# Patient Record
Sex: Male | Born: 1955 | Race: White | Hispanic: No | Smoking: Never smoker
Health system: Southern US, Community
[De-identification: ages and names within clinical notes are randomized; demographics above are authoritative.]

## PROBLEM LIST (undated history)

## (undated) DIAGNOSIS — I10 Essential (primary) hypertension: Secondary | ICD-10-CM

## (undated) DIAGNOSIS — E785 Hyperlipidemia, unspecified: Secondary | ICD-10-CM

## (undated) HISTORY — PX: PELVIS CLOSED REDUCTION: SHX1023

---

## 2011-02-14 ENCOUNTER — Ambulatory Visit
Admission: RE | Admit: 2011-02-14 | Discharge: 2011-02-14 | Disposition: A | Discharge status: Home / Self Care | Payer: BC Managed Care – PPO | Source: Ambulatory Visit | Attending: Family Medicine | Admitting: Family Medicine

## 2011-02-14 ENCOUNTER — Encounter: Payer: Self-pay | Admitting: Family Medicine

## 2011-02-14 ENCOUNTER — Inpatient Hospital Stay (INDEPENDENT_AMBULATORY_CARE_PROVIDER_SITE_OTHER)
Admission: RE | Admit: 2011-02-14 | Discharge: 2011-02-14 | Disposition: A | Discharge status: Home / Self Care | Payer: BC Managed Care – PPO | Source: Ambulatory Visit | Attending: Family Medicine | Admitting: Family Medicine

## 2011-02-14 ENCOUNTER — Other Ambulatory Visit: Payer: Self-pay | Admitting: Family Medicine

## 2011-02-14 DIAGNOSIS — R1032 Left lower quadrant pain: Secondary | ICD-10-CM

## 2011-02-14 DIAGNOSIS — K5730 Diverticulosis of large intestine without perforation or abscess without bleeding: Secondary | ICD-10-CM | POA: Insufficient documentation

## 2011-02-14 DIAGNOSIS — R109 Unspecified abdominal pain: Secondary | ICD-10-CM

## 2011-02-14 DIAGNOSIS — K5732 Diverticulitis of large intestine without perforation or abscess without bleeding: Secondary | ICD-10-CM

## 2011-02-14 DIAGNOSIS — I10 Essential (primary) hypertension: Secondary | ICD-10-CM

## 2011-02-14 MED ORDER — IOHEXOL 300 MG/ML  SOLN
100.0000 mL | Freq: Once | INTRAMUSCULAR | Status: AC | PRN
  Administered 2011-02-14: 100 mL via INTRAVENOUS

## 2011-02-15 ENCOUNTER — Telehealth (INDEPENDENT_AMBULATORY_CARE_PROVIDER_SITE_OTHER): Payer: Self-pay | Admitting: *Deleted

## 2011-04-15 ENCOUNTER — Encounter: Payer: Self-pay | Admitting: Family Medicine

## 2011-04-15 ENCOUNTER — Inpatient Hospital Stay (INDEPENDENT_AMBULATORY_CARE_PROVIDER_SITE_OTHER)
Admission: RE | Admit: 2011-04-15 | Discharge: 2011-04-15 | Disposition: A | Discharge status: Home / Self Care | Payer: BC Managed Care – PPO | Source: Ambulatory Visit | Attending: Family Medicine | Admitting: Family Medicine

## 2011-04-15 DIAGNOSIS — M109 Gout, unspecified: Secondary | ICD-10-CM

## 2011-04-15 DIAGNOSIS — I10 Essential (primary) hypertension: Secondary | ICD-10-CM

## 2011-09-22 NOTE — Letter (Signed)
Summary: Out of Work  [facility]  [address]   [place], [place] [address]   Phone: [phone]  Fax: [phone]    April 15, 2011   Employee:  Thelonious Drabik    To Whom It May Concern:   For Medical reasons, please excuse the above named employee from work today and tomorrow.   If you need additional information, please feel free to contact our office.         Sincerely,    Donna Christen MD

## 2011-09-22 NOTE — Progress Notes (Signed)
Summary: RIGHT FOOT PAIN/ANKLW (rm 2)   Vital Signs:  Patient Profile:   55 Years Old Male CC:      right foot/ankle pain Height:     70 inches Weight:      214 pounds O2 Sat:      98 % O2 treatment:    Room Air Temp:     98.1 degrees F oral Pulse rate:   86 / minute Resp:     16 per minute BP sitting:   176 / 127  (left arm)  Pt. in pain?   yes    Location:   right foot/ankle    Intensity:   8    Type:       throbbing  Vitals Entered By: Lajean Saver RN (April 15, 2011 9:01 AM)                  Serial Vital Signs/Assessments:  Time      Position  BP       Pulse  Resp  Temp     By 9:03 AM             161/128                        Lajean Saver RN   Updated Prior Medication List: TOPROL XL 100 MG XR24H-TAB (METOPROLOL SUCCINATE)   Current Allergies: No known allergies History of Present Illness Chief Complaint: right foot/ankle pain History of Present Illness:  Subjective:  Patient complains of a history of occasional flareups of gout in feet.  He has had recurrent pain/swelling in right ankle for about a week, not responding to Goody's Powder.  He reports that he ran out of his BP med, but has contacted his PCP for refill.  He feels well otherwise  REVIEW OF SYSTEMS Constitutional Symptoms      Denies fever, chills, night sweats, weight loss, weight gain, and fatigue.  Eyes       Denies change in vision, eye pain, eye discharge, glasses, contact lenses, and eye surgery. Ear/Nose/Throat/Mouth       Denies hearing loss/aids, change in hearing, ear pain, ear discharge, dizziness, frequent runny nose, frequent nose bleeds, sinus problems, sore throat, hoarseness, and tooth pain or bleeding.  Respiratory       Denies dry cough, productive cough, wheezing, shortness of breath, asthma, bronchitis, and emphysema/COPD.  Cardiovascular       Denies murmurs, chest pain, and tires easily with exhertion.    Gastrointestinal       Denies stomach pain, nausea/vomiting,  diarrhea, constipation, blood in bowel movements, and indigestion. Genitourniary       Denies painful urination, kidney stones, and loss of urinary control. Neurological       Denies paralysis, seizures, and fainting/blackouts. Musculoskeletal       Complains of joint pain, redness, and swelling.      Denies muscle pain, joint stiffness, decreased range of motion, muscle weakness, and gout.      Comments: right ankle/foot Skin       Denies bruising, unusual mles/lumps or sores, and hair/skin or nail changes.  Psych       Denies mood changes, temper/anger issues, anxiety/stress, speech problems, depression, and sleep problems. Other Comments: Right foot/ankle pain flares up intetmittently. Flared about 1 week ago, worse last night. 8/10 pain. Taken Arthritis BC without relief. Patient BP elevated. Denies HA or any other symptoms. Patient stopped taking Toprol XL wiithout MD knowledge  Past History:  Past Medical History: Reviewed history from 02/14/2011 and no changes required. Hypertension  Past Surgical History: Reviewed history from 02/14/2011 and no changes required. Broken pelvis Neck  Family History: Reviewed history from 02/14/2011 and no changes required. Mother, Healthy Father, Healthy  Social History: Reviewed history from 02/14/2011 and no changes required. 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  Decreased full range of motion.  Minimal warmth.  Diffuse mild tenderness.  Distal neurovascular intact  Assessment  Assessed HYPERTENSION as deteriorated - Donna Christen MD New Problems: GOUT, ACUTE (ICD-274.01)   Plan New Medications/Changes: LORTAB 5 5-500 MG TABS (HYDROCODONE-ACETAMINOPHEN) One or two tabs by mouth q4 to 6hr as needed pain  #12 (twelve) x 0, 04/15/2011, Donna Christen MD COLCRYS 0.6 MG TABS (COLCHICINE) 2 by mouth stat.  Then take  one tab, one hour later.  Max of 3 tabs/attack  #3 x 2, 04/15/2011, Donna Christen MD  New Orders: Est. Patient Level III [phone] Planning Comments:   Begin Colcrys.  Lortab for pain.  Increase fluid intake. Resume BP medication, and follow-up with PCP.   The patient and/or caregiver has been counseled thoroughly with regard to medications prescribed including dosage, schedule, interactions, rationale for use, and possible side effects and they verbalize understanding.  Diagnoses and expected course of recovery discussed and will return if not improved as expected or if the condition worsens. Patient and/or caregiver verbalized understanding.  Prescriptions: LORTAB 5 5-500 MG TABS (HYDROCODONE-ACETAMINOPHEN) One or two tabs by mouth q4 to 6hr as needed pain  #12 (twelve) x 0   Entered and Authorized by:   Donna Christen MD   Signed by:   Donna Christen MD on 04/15/2011   Method used:   Print then Give to Patient   RxID:   [id number] COLCRYS 0.6 MG TABS (COLCHICINE) 2 by mouth stat.  Then take one tab, one hour later.  Max of 3 tabs/attack  #3 x 2   Entered and Authorized by:   Donna Christen MD   Signed by:   Donna Christen MD on 04/15/2011   Method used:   Print then Give to Patient   RxID:   [id number]   Orders Added: 1)  Est. Patient Level III [36644]

## 2011-09-22 NOTE — Progress Notes (Signed)
Summary: SEVERE ABDOMINAL PAIN Room 5   Vital Signs:  Patient Profile:   55 Years Old Male CC:      abdominal Pain x 4 days, constipation Height:     70 inches Weight:      220 pounds O2 Sat:      97 % O2 treatment:    Room Air Temp:     97.7 degrees F 220 Pulse rate:   106 / minute Pulse rhythm:   irregular Resp:     16 per minute BP sitting:   154 / 126  (left arm) Cuff size:   regular  Vitals Entered By: Emilio Math (February 14, 2011 8:27 AM)                  Current Allergies: No known allergies History of Present Illness Chief Complaint: abdominal Pain x 4 days, constipation History of Present Illness:  Subjective:  Patient complains of onset of constipation and smaller bowel  movements 3 days ago associated with decreased appetite.  He began to feel "bloated" in lower abdomen, with sensation of tensmus.  No blood in stool.  Over the past 24 hours he has developed increasing pain in his left lower abdomen.  He took Miralax last night with some decrease in abdominal discomfort but still present.  Last night he felt hot.  No urinary symptoms. He notes that his blood pressure is usually about 125/90-100. Past history of pelvis fracture 1995.  History of colonic polyps with two colonoscopies (last one two years ago).  No family history of GI disorders.  Current Meds TOPROL XL 100 MG XR24H-TAB (METOPROLOL SUCCINATE)  CIPROFLOXACIN HCL 750 MG TABS (CIPROFLOXACIN HCL) One by mouth two times a day METRONIDAZOLE 500 MG TABS (METRONIDAZOLE) One by mouth q6hr LORTAB 7.5 7.5-500 MG TABS (HYDROCODONE-ACETAMINOPHEN) 1 by mouth q6hr as needed pain  REVIEW OF SYSTEMS Constitutional Symptoms      Denies fever, chills, night sweats, weight loss, weight gain, and fatigue.  Eyes       Denies change in vision, eye pain, eye discharge, glasses, contact lenses, and eye surgery. Ear/Nose/Throat/Mouth       Denies hearing loss/aids, change in hearing, ear pain, ear discharge, dizziness,  frequent runny nose, frequent nose bleeds, sinus problems, sore throat, hoarseness, and tooth pain or bleeding.  Respiratory       Denies dry cough, productive cough, wheezing, shortness of breath, asthma, bronchitis, and emphysema/COPD.  Cardiovascular       Denies murmurs, chest pain, and tires easily with exhertion.    Gastrointestinal       Complains of stomach pain and constipation.      Denies nausea/vomiting, diarrhea, blood in bowel movements, and indigestion. Genitourniary       Denies painful urination, kidney stones, and loss of urinary control. Neurological       Denies paralysis, seizures, and fainting/blackouts. Musculoskeletal       Denies muscle pain, joint pain, joint stiffness, decreased range of motion, redness, swelling, muscle weakness, and gout.  Skin       Denies bruising, unusual mles/lumps or sores, and hair/skin or nail changes.  Psych       Denies mood changes, temper/anger issues, anxiety/stress, speech problems, depression, and sleep problems.  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  Extraocular movement is intact.  Conjunctivae are not inflamed.  Mouth/pharynx:  moist mucous membranes  Neck:  Supple.  No adenopathy is present. Lungs:  Clear to auscultation.  Breath sounds are equal.  Heart:  Regular rate and rhythm without murmurs, rubs, or gallops.  Abdomen:   Tenderness in the left lower quatdrant without masses or hepatosplenomegaly.  No rebound tenderness.   Bowel sounds are present.  No CVA or flank tenderness.  Surgical scars present in both left and right lower quadrants. Genitourinary:  Penis normal without lesions or urethral  discharge.  Scrotum is normal.  Testes are descended bilaterally without nodules or tenderness.  No hernias are palpated.  No regional lymphadenopathy palpated.   Rectal Exam:  Anus is normal without surrounding erythema.   No external hemorrhoids are present.  No lesions are palpated in the rectal vault.  Stool is trace heme negative.  Prostate is slightly enlarged but symmetric without tenderness or nodules.  Skin:  No rash urinalysis (dipstick):  2+ bili, trace ket, 1+ blood, 2+ prot, + nit CBC:  WBC 14.3; 11. 7 LY, 8.7 MO, 79.6 GR; Hgb 17.1 CT Abdomen/pelvis w/contrast: IMPRESSION:  1.  Mild to moderate diverticulitis involving the proximal sigmoid colon. 2.  No evidence of diverticular abscess or other complication. 3.  Small periumbilical hernia containing only fat. 4.  Hepatic steatosis. Assessment New Problems: DIVERTICULOSIS, SIGMOID COLON (ICD-562.10) DIVERTICULITIS, ACUTE (ICD-562.11) ABDOMINAL PAIN, ACUTE (ICD-789.00) HYPERTENSION (ICD-401.9)   Plan New Medications/Changes: LORTAB 7.5 7.5-500 MG TABS (HYDROCODONE-ACETAMINOPHEN) 1 by mouth q6hr as needed pain  #10 (ten) x 0, 02/14/2011, Donna Christen MD METRONIDAZOLE 500 MG TABS (METRONIDAZOLE) One by mouth q6hr  #28 x 0, 02/14/2011, Donna Christen MD CIPROFLOXACIN HCL 750 MG TABS (CIPROFLOXACIN HCL) One by mouth two times a day  #14 x 0, 02/14/2011, Donna Christen MD  New Orders: T-CT Abdomen/pelvis w [56213] Urinalysis [CPT-81003] CBC w/Diff [[id number]] DRE [G0102] New Patient Level V [99205] Planning Comments:   Begin clear liquids;  may slowly and carefully advance after 24 hours.  Rest. Begin Cipro and Flagyl.  Lortab for pain.  Given a Water quality scientist patient information and instruction sheet on topic.  Begin stool softener as diet is advanced.  Follow-up with PCP for hypertension  Follow-up with gastroenterologist within one week If symptoms become significantly worse during the night or over the weekend, proceed to  the local emergency room.   The patient and/or caregiver has been counseled thoroughly with regard to medications prescribed including dosage, schedule, interactions, rationale for use, and possible side effects and they verbalize understanding.  Diagnoses and expected course of recovery discussed and will return if not improved as expected or if the condition worsens. Patient and/or caregiver verbalized understanding.  Prescriptions: LORTAB 7.5 7.5-500 MG TABS (HYDROCODONE-ACETAMINOPHEN) 1 by mouth q6hr as needed pain  #10 (ten) x 0   Entered and Authorized by:   Donna Christen MD   Signed by:   Donna Christen MD on 02/14/2011   Method used:   Print then Give to Patient   RxID:   [id number] METRONIDAZOLE 500 MG TABS (METRONIDAZOLE) One by mouth q6hr  #28 x 0   Entered and Authorized by:   Donna Christen MD   Signed by:   Donna Christen MD on 02/14/2011   Method used:   Print then Give to Patient   RxID:   [id number] CIPROFLOXACIN HCL 750 MG TABS (  CIPROFLOXACIN HCL) One by mouth two times a day  #14 x 0   Entered and Authorized by:   Donna Christen MD   Signed by:   Donna Christen MD on 02/14/2011   Method used:   Print then Give to Patient   RxID:   [id number]   Orders Added: 1)  T-CT Abdomen/pelvis w [14782] 2)  Urinalysis [CPT-81003] 3)  CBC w/Diff [[id number]] 4)  DRE [G0102] 5)  New Patient Level V [99205]  Appended Document: SEVERE ABDOMINAL PAIN Room 5 UA: GLU: Neg Bil: +2 KET: trace SG>=1.030 BLO: 1+ pH: 5.5 PRO: 2+ URO: 1.0 NIT: Positive LEU: Negative

## 2011-09-22 NOTE — Telephone Encounter (Signed)
  Phone Note Outgoing Call   Call placed by: Clemens Catholic LPN,  February 15, 2011 2:45 PM Call placed to: Patient Summary of Call: call back: left message to call back if he has any questions or concerns. Initial call taken by: Clemens Catholic LPN,  February 15, 2011 2:46 PM

## 2013-08-01 ENCOUNTER — Emergency Department (INDEPENDENT_AMBULATORY_CARE_PROVIDER_SITE_OTHER)
Admission: EM | Admit: 2013-08-01 | Discharge: 2013-08-01 | Disposition: A | Discharge status: Home / Self Care | Payer: BC Managed Care – PPO | Source: Home / Self Care | Attending: Family Medicine | Admitting: Family Medicine

## 2013-08-01 ENCOUNTER — Encounter: Payer: Self-pay | Admitting: Emergency Medicine

## 2013-08-01 ENCOUNTER — Emergency Department (INDEPENDENT_AMBULATORY_CARE_PROVIDER_SITE_OTHER): Payer: BC Managed Care – PPO

## 2013-08-01 DIAGNOSIS — M12839 Other specific arthropathies, not elsewhere classified, unspecified wrist: Secondary | ICD-10-CM

## 2013-08-01 DIAGNOSIS — S63599A Other specified sprain of unspecified wrist, initial encounter: Secondary | ICD-10-CM

## 2013-08-01 HISTORY — DX: Essential (primary) hypertension: I10

## 2013-08-01 HISTORY — DX: Hyperlipidemia, unspecified: E78.5

## 2013-08-01 MED ORDER — TRAMADOL-ACETAMINOPHEN 37.5-325 MG PO TABS: 1.0000 | ORAL_TABLET | Freq: Four times a day (QID) | ORAL | Status: DC | PRN

## 2013-08-01 MED ORDER — MELOXICAM 15 MG PO TABS: 15.0000 mg | ORAL_TABLET | Freq: Every day | ORAL | Status: DC

## 2013-08-01 NOTE — ED Notes (Signed)
Pt c/o of RT wrist injury x last night after tripping over a dog bowl at home. He has taken goody's powder for pain.

## 2013-08-01 NOTE — ED Provider Notes (Signed)
CSN: [id number]     Arrival date & time 08/01/13  8469 History   First MD Initiated Contact with Patient 08/01/13 [phone]     Chief Complaint  Patient presents with  . Wrist Injury    HPI  R wrist pain x 2 days  Pt tripped over a dog bowl yesterday, landing on R wrist  Has had R wrist pain and swelling since this point.  Decreased ROM 2/2 pain  Pain is diffuse across wrist, but most predominant on ulnar side.  Neurovascularly intact.  No CP, SOB.   Past Medical History  Diagnosis Date  . Hypertension   . Hyperlipemia    Past Surgical History  Procedure Laterality Date  . Pelvis closed reduction     History reviewed. No pertinent family history. History  Substance Use Topics  . Smoking status: Never Smoker   . Smokeless tobacco: Not on file  . Alcohol Use: Yes     Comment: 12 qwk    Review of Systems  All other systems reviewed and are negative.    Allergies  Review of patient's allergies indicates no known allergies.  Home Medications   Current Outpatient Rx  Name  Route  Sig  Dispense  Refill  . UNKNOWN TO PATIENT                BP 148/103  Pulse 98  Temp(Src) 97.6 F (36.4 C) (Oral)  Resp 18  Ht 5\' 10"  (1.778 m)  Wt 216 lb (97.977 kg)  BMI 30.99 kg/m2  SpO2 97% Physical Exam  Constitutional: He appears well-developed and well-nourished.  HENT:  Head: Normocephalic and atraumatic.  Eyes: Conjunctivae are normal. Pupils are equal, round, and reactive to light.  Neck: Normal range of motion.  Cardiovascular: Normal rate and regular rhythm.   Pulmonary/Chest: Effort normal.  Abdominal: Soft. Bowel sounds are normal.  Musculoskeletal:       Hands: R wrist swelling and TTP  Decreased ROM 2/2 pain  + snuffbox TTP   Neurological: He is alert.  Skin: Skin is warm.    ED Course  Procedures (including critical care time) Labs Review Labs Reviewed - No data to display Imaging Review Dg Wrist Complete Right  08/01/2013   CLINICAL DATA:  Right  wrist pain secondary to a fall last night.  EXAM: RIGHT WRIST - COMPLETE 3+ VIEW  COMPARISON:  None.  FINDINGS: There is no fracture or dislocation. 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 Soft tissue swelling over the ulna and dorsal aspect of the wrist. Arthritic changes between the scaphoid and trapezium and trapezoid as well as at the 1st carpal metacarpal joint.  IMPRESSION: No acute osseous abnormality. CPPD arthritis.   Electronically Signed   By: Geanie Cooley M.D.   On: 08/01/2013 10:22      MDM   1. Wrist sprain and strain, right, initial encounter    Xrays negative for fracture.  Noted snuffbox tenderness Will place in thumb spica.  Tramadol for pain (avoiding NSAIDs given BP) Noted elevated BP in setting of pain. Discussed surveillance and CV red flags.  Follow up with PCP for BP check today.  Follow up with sports medicine in 1 week.     The patient and/or caregiver has been counseled thoroughly with regard to treatment plan and/or medications prescribed including dosage, schedule, interactions, rationale for use, and possible side effects and they verbalize understanding. Diagnoses and expected course of recovery discussed and will return if not improved  as expected or if the condition worsens. Patient and/or caregiver verbalized understanding.         Doree Albee, MD 08/01/13 [phone]

## 2013-08-02 ENCOUNTER — Telehealth: Payer: Self-pay | Admitting: *Deleted

## 2014-04-13 ENCOUNTER — Ambulatory Visit (INDEPENDENT_AMBULATORY_CARE_PROVIDER_SITE_OTHER): Payer: BC Managed Care – PPO | Admitting: Sports Medicine

## 2014-04-13 ENCOUNTER — Emergency Department (INDEPENDENT_AMBULATORY_CARE_PROVIDER_SITE_OTHER): Payer: BC Managed Care – PPO

## 2014-04-13 ENCOUNTER — Emergency Department
Admission: EM | Admit: 2014-04-13 | Discharge: 2014-04-13 | Disposition: A | Discharge status: Home / Self Care | Payer: BC Managed Care – PPO | Source: Home / Self Care | Attending: Family Medicine | Admitting: Family Medicine

## 2014-04-13 ENCOUNTER — Encounter: Payer: Self-pay | Admitting: Emergency Medicine

## 2014-04-13 DIAGNOSIS — S52041A Displaced fracture of coronoid process of right ulna, initial encounter for closed fracture: Secondary | ICD-10-CM | POA: Insufficient documentation

## 2014-04-13 DIAGNOSIS — S59909A Unspecified injury of unspecified elbow, initial encounter: Secondary | ICD-10-CM

## 2014-04-13 DIAGNOSIS — M25521 Pain in right elbow: Secondary | ICD-10-CM

## 2014-04-13 DIAGNOSIS — W19XXXA Unspecified fall, initial encounter: Secondary | ICD-10-CM

## 2014-04-13 DIAGNOSIS — S52043A Displaced fracture of coronoid process of unspecified ulna, initial encounter for closed fracture: Secondary | ICD-10-CM

## 2014-04-13 DIAGNOSIS — S6990XA Unspecified injury of unspecified wrist, hand and finger(s), initial encounter: Secondary | ICD-10-CM

## 2014-04-13 DIAGNOSIS — M19029 Primary osteoarthritis, unspecified elbow: Secondary | ICD-10-CM

## 2014-04-13 DIAGNOSIS — S59919A Unspecified injury of unspecified forearm, initial encounter: Secondary | ICD-10-CM

## 2014-04-13 DIAGNOSIS — S59901S Unspecified injury of right elbow, sequela: Secondary | ICD-10-CM

## 2014-04-13 DIAGNOSIS — M25529 Pain in unspecified elbow: Secondary | ICD-10-CM

## 2014-04-13 MED ORDER — HYDROCODONE-ACETAMINOPHEN 5-325 MG PO TABS: 1.0000 | ORAL_TABLET | Freq: Three times a day (TID) | ORAL | Status: DC | PRN

## 2014-04-13 MED ORDER — KETOROLAC TROMETHAMINE 30 MG/ML IJ SOLN
30.0000 mg | Freq: Once | INTRAMUSCULAR | Status: AC
  Administered 2014-04-13: 30 mg via INTRAVENOUS

## 2014-04-13 NOTE — ED Provider Notes (Signed)
CSN: [id number]     Arrival date & time 04/13/14  0802 History   First MD Initiated Contact with Patient 04/13/14 [phone]     Chief Complaint  Patient presents with  . Elbow Injury    HPI  R elbow pain x 1 day Pt accidentally fell in the garage yesterday. Was a mechanical fall.  Landed on R elbow. Does not feel like it was a hard fall.  Pt states that he has had worsening pain and swelling in elbow since this point.  Has a baseline hx/o R elbow fx as a teenager.  Decreased ROM at baseline. This has not worsened.  Has had worsening swelling and pain over olecranon and lateral elbow.  No distal numbness or paresthesias.  Grip strength mildly decreased.   Past Medical History  Diagnosis Date  . Hypertension   . Hyperlipemia    Past Surgical History  Procedure Laterality Date  . Pelvis closed reduction     No family history on file. History  Substance Use Topics  . Smoking status: Never Smoker   . Smokeless tobacco: Not on file  . Alcohol Use: Yes     Comment: 12 qwk    Review of Systems  All other systems reviewed and are negative.   Allergies  Review of patient's allergies indicates not on file.  Home Medications   Prior to Admission medications   Medication Sig Start Date End Date Taking? Authorizing Ulysees Robarts  traMADol-acetaminophen (ULTRACET) 37.5-325 MG per tablet Take 1 tablet by mouth every 6 (six) hours as needed for pain. 08/01/13   Doree AlbeeSteven Newton, MD  UNKNOWN TO PATIENT     Historical Elison Worrel, MD   BP 149/98  Pulse 93  Temp(Src) 97.5 F (36.4 C) (Oral)  Ht 5\' 9"  (1.753 m)  Wt 189 lb (85.73 kg)  BMI 27.90 kg/m2  SpO2 97% Physical Exam  Constitutional: He appears well-developed and well-nourished.  HENT:  Head: Normocephalic and atraumatic.  Eyes: Conjunctivae are normal. Pupils are equal, round, and reactive to light.  Neck: Normal range of motion. Neck supple.  Cardiovascular: Normal rate and regular rhythm.   Pulmonary/Chest: Effort normal.    Abdominal: Soft.  Musculoskeletal:       Arms: Neurological: He is alert.  Skin: Skin is warm.    ED Course  Procedures (including critical care time) Labs Review Labs Reviewed - No data to display  Imaging Review Dg Elbow Complete Right  04/13/2014   CLINICAL DATA:  Fall, right elbow pain and swelling. The patient reports a remote elbow fracture.  EXAM: RIGHT ELBOW - COMPLETE 3+ VIEW  COMPARISON:  None.  FINDINGS: There is no evidence of fracture, dislocation, or joint effusion. There is no evidence of arthropathy or other focal bone abnormality. Soft tissues are unremarkable. Well corticated osseous fragment are identified adjacent to the medial humeral epicondyle al with overlying soft tissue swelling. Similar finding noted laterally adjacent to the humeral epicondyle.  IMPRESSION: Well corticated osseous fragments adjacent to the lateral and medial humeral epicondyles, with associated medial soft tissue swelling. These findings could reflect remote elbow fracture as provided in the clinical history, although an element of superimposed acute avulsion fracture could have a similar appearance, especially medially. An occult radial head fracture could also present similarly, but there is no plain radiographic evidence for that abnormality.   Electronically Signed   By: Christiana PellantGretchen  Green M.D.   On: 04/13/2014 08:52     MDM   1. Elbow injury, right, sequela  Ddx includes elbow sprain vs. Fracture. Unclear findings on imaging.  Will consult sports medicine for further evaluation.  Treatment plan and follow up per sports medicine recs.     The patient and/or caregiver has been counseled thoroughly with regard to treatment plan and/or medications prescribed including dosage, schedule, interactions, rationale for use, and possible side effects and they verbalize understanding. Diagnoses and expected course of recovery discussed and will return if not improved as expected or if the condition  worsens. Patient and/or caregiver verbalized understanding.         Doree AlbeeSteven Newton, MD 04/13/14 [phone]

## 2014-04-13 NOTE — Assessment & Plan Note (Addendum)
This is suspicious for a lateral epicondylar avulsion, he also has what appears to be a small dent in the radial head. He does endorse an old elbow fracture, the results of which are not available to me. Strap with compressive dressing, sling, I would like a CT scan of the elbow for further elucidation. Hydrocodone for pain, return to see me in 2 weeks.  CT scan shows acute fracture of the coronoid process. 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  I billed a fracture code for this visit, all subsequent visits for this complaint will be "post-op checks" in the global period.

## 2014-04-13 NOTE — Progress Notes (Signed)
   Subjective:    I'm seeing this patient as a consultation for:  Dr. Alvester MorinNewton  CC: Right elbow pain  HPI: Greg Brownsnthony had a fall yesterday, he had immediate pain, swelling, bruising, and inability to use his elbow. X-ray showed some old changes, and I was called for further evaluation and definitive treatment. Pain is worst on the anterior and lateral elbow, without radiation, there is significant swelling.  Past medical history, Surgical history, Family history not pertinant except as noted below, Social history, Allergies, and medications have been entered into the medical record, reviewed, and no changes needed.   Review of Systems: No headache, visual changes, nausea, vomiting, diarrhea, constipation, dizziness, abdominal pain, skin rash, fevers, chills, night sweats, weight loss, swollen lymph nodes, body aches, joint swelling, muscle aches, chest pain, shortness of breath, mood changes, visual or auditory hallucinations.   Objective:   General: Well Developed, well nourished, and in no acute distress.  Neuro/Psych: Alert and oriented x3, extra-ocular muscles intact, able to move all 4 extremities, sensation grossly intact. Skin: Warm and dry, no rashes noted.  Respiratory: Not using accessory muscles, speaking in full sentences, trachea midline.  Cardiovascular: Pulses palpable, no extremity edema. Abdomen: Does not appear distended. Right elbow: Tender to palpation over the anterior joint, and radial head.  X-rays of the elbow show old well corticated fragments near the lateral epicondyle.  CT scan of the elbow shows an acute coronoid process fracture of the ulna, there is also severe osteoarthritis with possible intra-articular loose bodies.  Impression and Recommendations:   This case required medical decision making of moderate complexity.

## 2014-04-13 NOTE — ED Notes (Signed)
Rt elbow injury from fall last night

## 2014-04-14 ENCOUNTER — Telehealth: Payer: Self-pay

## 2014-04-14 NOTE — Telephone Encounter (Signed)
Greg Warner is unable to sleep because of the pain in his elbow. The hydrocodone is not lasting long enough. He wants to know if he can take the hydrocodone more often.

## 2014-04-14 NOTE — Telephone Encounter (Signed)
Pt aware coming Wed

## 2014-04-14 NOTE — Telephone Encounter (Signed)
2 options, increase to two tabs q8h or so, or I can switch to dilaudid (hydromorphone).  Either way I will refill when he gets close to running out.  Pain will improve a lot when he is immobilized in a cast so have him get in ASAP once swelling is down.

## 2014-04-19 ENCOUNTER — Encounter: Payer: Self-pay | Admitting: Sports Medicine

## 2014-04-19 ENCOUNTER — Telehealth: Payer: Self-pay

## 2014-04-19 ENCOUNTER — Ambulatory Visit (INDEPENDENT_AMBULATORY_CARE_PROVIDER_SITE_OTHER): Payer: BC Managed Care – PPO | Admitting: Sports Medicine

## 2014-04-19 VITALS — BP 147/92 | HR 79 | Ht 69.0 in | Wt 194.0 lb

## 2014-04-19 DIAGNOSIS — S52041D Displaced fracture of coronoid process of right ulna, subsequent encounter for closed fracture with routine healing: Secondary | ICD-10-CM

## 2014-04-19 DIAGNOSIS — S52043A Displaced fracture of coronoid process of unspecified ulna, initial encounter for closed fracture: Secondary | ICD-10-CM

## 2014-04-19 MED ORDER — HYDROCODONE-ACETAMINOPHEN 5-325 MG PO TABS: 1.0000 | ORAL_TABLET | Freq: Three times a day (TID) | ORAL | Status: DC | PRN

## 2014-04-19 NOTE — Assessment & Plan Note (Signed)
Long-arm cast placed. Continue for 2 weeks, return 2 weeks remove cast. Refilling pain medication.

## 2014-04-19 NOTE — Progress Notes (Signed)
  Subjective: Greg Warner is approximately one week post-closed nondisplaced fracture of the coronoid process of his ulna. He has been in a sling, swelling is significantly improved, he is here for long cast placement. Pain is significantly better.   Objective: General: Well-developed, well-nourished, and in no acute distress. Right elbow: Still with limited range of motion and tenderness to palpation anteriorly, really not much tenderness to palpation over the medial or lateral epicondyles.  Long-arm cast placed, red, white, and blue.  Assessment/plan:

## 2014-04-19 NOTE — Telephone Encounter (Signed)
Patient called stated that he was seen today, he request a letter explaining what his restrictions are for work. Anzlee Hinesley,CMA

## 2014-04-20 NOTE — Telephone Encounter (Signed)
Oh yes, forgot, letter is in my box.

## 2014-04-20 NOTE — Telephone Encounter (Signed)
Patient has been informed that letter is ready for pickup. Rhonda Cunningham,CMA  

## 2014-05-03 ENCOUNTER — Ambulatory Visit (INDEPENDENT_AMBULATORY_CARE_PROVIDER_SITE_OTHER): Payer: BC Managed Care – PPO | Admitting: Sports Medicine

## 2014-05-03 ENCOUNTER — Encounter: Payer: Self-pay | Admitting: Sports Medicine

## 2014-05-03 VITALS — BP 132/81 | HR 68 | Ht 69.0 in | Wt 193.0 lb

## 2014-05-03 DIAGNOSIS — S52041D Displaced fracture of coronoid process of right ulna, subsequent encounter for closed fracture with routine healing: Secondary | ICD-10-CM

## 2014-05-03 DIAGNOSIS — S5290XD Unspecified fracture of unspecified forearm, subsequent encounter for closed fracture with routine healing: Secondary | ICD-10-CM

## 2014-05-03 NOTE — Assessment & Plan Note (Signed)
Continue sling for an additional 2 weeks. Return to see me in 2 weeks, if continues to do well we can transition into physical therapy to regain range of motion.

## 2014-05-03 NOTE — Progress Notes (Signed)
  Subjective: 2 weeks post fracture of the coronoid process of the right ulna, doing well in a long-arm cast. Pain-free.   Objective: General: Well-developed, well-nourished, and in no acute distress. Right elbow: Cast is removed, no tenderness over the fracture site, swelling has improved significantly, still with very limited range of motion.  Assessment/plan:

## 2014-05-22 ENCOUNTER — Ambulatory Visit: Payer: BC Managed Care – PPO | Admitting: Sports Medicine

## 2014-05-24 ENCOUNTER — Ambulatory Visit (INDEPENDENT_AMBULATORY_CARE_PROVIDER_SITE_OTHER): Payer: BC Managed Care – PPO | Admitting: Sports Medicine

## 2014-05-24 ENCOUNTER — Emergency Department
Admission: EM | Admit: 2014-05-24 | Discharge: 2014-05-24 | Disposition: A | Discharge status: Home / Self Care | Payer: BC Managed Care – PPO | Source: Home / Self Care | Attending: Family Medicine | Admitting: Family Medicine

## 2014-05-24 ENCOUNTER — Encounter: Payer: Self-pay | Admitting: Sports Medicine

## 2014-05-24 ENCOUNTER — Encounter: Payer: Self-pay | Admitting: Emergency Medicine

## 2014-05-24 DIAGNOSIS — M7022 Olecranon bursitis, left elbow: Secondary | ICD-10-CM | POA: Insufficient documentation

## 2014-05-24 DIAGNOSIS — M7020 Olecranon bursitis, unspecified elbow: Secondary | ICD-10-CM

## 2014-05-24 NOTE — Discharge Instructions (Signed)
Olecranon Bursitis Bursitis is swelling and soreness (inflammation) of a fluid-filled sac (bursa) that covers and protects a joint. Olecranon bursitis occurs over the elbow.  CAUSES Bursitis can be caused by injury, overuse of the joint, arthritis, or infection.  SYMPTOMS   Tenderness, swelling, warmth, or redness over the elbow.  Elbow pain with movement. This is greater with bending the elbow.  Squeaking sound when the bursa is rubbed or moved.  Increasing size of the bursa without pain or discomfort.  Fever with increasing pain and swelling if the bursa becomes infected. HOME CARE INSTRUCTIONS   Put ice on the affected area.  Put ice in a plastic bag.  Place a towel between your skin and the bag.  Leave the ice on for 15-20 minutes each hour while awake. Do this for the first 2 days.  When resting, elevate your elbow above the level of your heart. This helps reduce swelling.  Continue to put the joint through a full range of motion 4 times per day. Rest the injured joint at other times. When the pain lessens, begin normal slow movements and usual activities.  Only take over-the-counter or prescription medicines for pain, discomfort, or fever as directed by your caregiver.  Reduce your intake of milk and related dairy products (cheese, yogurt). They may make your condition worse. SEEK IMMEDIATE MEDICAL CARE IF:   Your pain increases even during treatment.  You have a fever.  You have heat and inflammation over the bursa and elbow.  You have a red line that goes up your arm.  You have pain with movement of your elbow. MAKE SURE YOU:   Understand these instructions.  Will watch your condition.  Will get help right away if you are not doing well or get worse. Document Released: 11/05/2006 Document Revised: 12/29/2011 Document Reviewed: 09/21/2007 [employer] Patient Information 2015 [employer]. This information is not intended to replace advice given to you by your  health care provider. Make sure you discuss any questions you have with your health care provider.  

## 2014-05-24 NOTE — ED Provider Notes (Signed)
CSN: [id number]     Arrival date & time 05/24/14  0803 History   First MD Initiated Contact with Patient 05/24/14 [id number]     Chief Complaint  Patient presents with  . Joint Swelling  . Elbow Pain      HPI Comments: With patient's recent injury to his right elbow, he has been using his left arm and elbow to a greater extent.  Two weeks ago he had swelling over his left olecranon bursa that resolved after a week.  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  He denies recent trauma.  No fevers, chills, and sweats.  Patient is a 58 y.o. male presenting with arm injury. The history is provided by the patient.  Arm Injury Location:  Elbow Time since incident:  2 days Injury: no   Elbow location:  L elbow Pain details:    Quality:  Aching   Radiates to:  Does not radiate   Severity:  Mild   Onset quality:  Gradual   Duration:  2 days   Timing:  Constant   Progression:  Worsening Chronicity:  Recurrent Handedness:  Right-handed Prior injury to area:  No Relieved by:  None tried Exacerbated by: flexion. Ineffective treatments:  None tried Associated symptoms: decreased range of motion, stiffness and swelling   Associated symptoms: no fever, no muscle weakness, no numbness and no tingling     Past Medical History  Diagnosis Date  . Hypertension   . Hyperlipemia    Past Surgical History  Procedure Laterality Date  . Pelvis closed reduction     History reviewed. No pertinent family history. History  Substance Use Topics  . Smoking status: Never Smoker   . Smokeless tobacco: Not on file  . Alcohol Use: Yes     Comment: 12 qwk    Review of Systems  Constitutional: Negative for fever.  Musculoskeletal: Positive for stiffness.  All other systems reviewed and are negative.   Allergies  Review of patient's allergies indicates no known allergies.  Home Medications    Prior to Admission medications   Medication Sig Start Date End Date Taking? Authorizing Provider  HYDROcodone-acetaminophen (NORCO/VICODIN) 5-325 MG per tablet Take 1 tablet by mouth every 8 (eight) hours as needed for moderate pain. 04/19/14   Monica Bectonhomas J Thekkekandam, MD  UNKNOWN TO PATIENT     Historical Provider, MD   BP 126/83  Pulse 82  Temp(Src) 97.8 F (36.6 C) (Oral)  Resp 16  Ht 5\' 9"  (1.753 m)  Wt 194 lb (87.998 kg)  BMI 28.64 kg/m2  SpO2 97% Physical Exam  Nursing note and vitals reviewed. Constitutional: He is oriented to person, place, and time. He appears well-developed and well-nourished. No distress.  HENT:  Head: Normocephalic.  Eyes: Conjunctivae are normal. Pupils are equal, round, and reactive to light.  Musculoskeletal:       Left elbow: He exhibits decreased range of motion, swelling and laceration. He exhibits no deformity. Tenderness found. Olecranon process tenderness noted.  Left elbow has decreased range of motion to full flexion.  There is swelling of the olecranon bursa with mild erythema, tenderness, and warmth  Neurological: He is alert and oriented to person, place, and time.  Skin: Skin is warm and dry.    ED Course  Procedures  none      MDM   1. Olecranon bursitis of left  elbow    Will refer to Dr. Rodney Langton for aspiration and further management.    Lattie Haw, MD 05/24/14 1025

## 2014-05-24 NOTE — Progress Notes (Signed)
   Subjective:    I'm seeing this patient as a consultation for:  Dr. Cathren HarshBeese  CC: Left elbow swelling  HPI: This is a very pleasant 58 year old male, I have been treating her for him for a right coronoid process fracture of the ulna. He had been in a long-arm cast for 2 weeks, we took him out of the cast and told him to wear a sling. Unfortunately he started to develop swelling in his left elbow over the olecranon bursa. Symptoms are moderate, persistent with pain but no fevers, chills or other constitutional symptoms. On further questioning it sounds as though he has a history of gout.  Past medical history, Surgical history, Family history not pertinant except as noted below, Social history, Allergies, and medications have been entered into the medical record, reviewed, and no changes needed.   Review of Systems: No headache, visual changes, nausea, vomiting, diarrhea, constipation, dizziness, abdominal pain, skin rash, fevers, chills, night sweats, weight loss, swollen lymph nodes, body aches, joint swelling, muscle aches, chest pain, shortness of breath, mood changes, visual or auditory hallucinations.   Objective:   General: Well Developed, well nourished, and in no acute distress.  Neuro/Psych: Alert and oriented x3, extra-ocular muscles intact, able to move all 4 extremities, sensation grossly intact. Skin: Warm and dry, no rashes noted.  Respiratory: Not using accessory muscles, speaking in full sentences, trachea midline.  Cardiovascular: Pulses palpable, no extremity edema. Abdomen: Does not appear distended. Right Elbow: Unremarkable to inspection. Improved range of motion with no further tenderness to palpation over the coronoid process, he does lack some extension, approximately 7. Strength is full to all of the above directions Stable to varus, valgus stress. Negative moving valgus stress test. Ulnar nerve does not sublux. Negative cubital tunnel Tinel's. Left  Elbow: Visibly swollen and tender olecranon bursitis Range of motion full pronation, supination, flexion, extension. Strength is full to all of the above directions Stable to varus, valgus stress. Negative moving valgus stress test., No erythema or induration. Ulnar nerve does not sublux. Negative cubital tunnel Tinel's.  Procedure: Real-time Ultrasound Guided aspiration/Injection of left olecranon bursa  Device: GE Logiq E  Verbal informed consent obtained.  Time-out conducted.  Noted no overlying erythema, induration, or other signs of local infection.  Skin prepped in a sterile fashion.  Local anesthesia: Topical Ethyl chloride.  With sterile technique and under real time ultrasound guidance:  Aspirated 10 cc of straw-colored fluid, non-hazy, then injected 1 cc kenalog 40, 1 cc lidocaine.   Completed without difficulty  Pain immediately resolved suggesting accurate placement of the medication.  Advised to call if fevers/chills, erythema, induration, drainage, or persistent bleeding.  Images permanently stored and available for review in the ultrasound unit.  Impression: Technically successful ultrasound guided injection.  Strapped the elbow with compressive dressing.  Impression and Recommendations:   This case required medical decision making of moderate complexity.

## 2014-05-24 NOTE — Assessment & Plan Note (Signed)
History of gout. Aspiration and injection as above. Return on the 11th.

## 2014-05-24 NOTE — ED Notes (Signed)
Pt c/o LT elbow swelling and pain x 2 days. Denies injury. He took Hydrocodone last night before bed.

## 2014-05-25 LAB — SYNOVIAL CELL COUNT + DIFF, W/ CRYSTALS
Crystals, Fluid: NONE SEEN
Eosinophils-Synovial: 0 % (ref 0–1)
Lymphocytes-Synovial Fld: 14 % (ref 0–20)
Monocyte/Macrophage: 14 % — ABNORMAL LOW (ref 50–90)
Neutrophil, Synovial: 72 % — ABNORMAL HIGH (ref 0–25)
WBC, Synovial: 860 cu mm — ABNORMAL HIGH (ref 0–200)

## 2014-05-28 LAB — BODY FLUID CULTURE
Gram Stain: NONE SEEN
Organism ID, Bacteria: NO GROWTH

## 2014-05-30 ENCOUNTER — Encounter: Payer: Self-pay | Admitting: Sports Medicine

## 2014-05-30 ENCOUNTER — Ambulatory Visit (INDEPENDENT_AMBULATORY_CARE_PROVIDER_SITE_OTHER): Payer: BC Managed Care – PPO

## 2014-05-30 ENCOUNTER — Ambulatory Visit (INDEPENDENT_AMBULATORY_CARE_PROVIDER_SITE_OTHER): Payer: BC Managed Care – PPO | Admitting: Sports Medicine

## 2014-05-30 VITALS — BP 113/81 | HR 87 | Ht 69.0 in | Wt 192.0 lb

## 2014-05-30 DIAGNOSIS — M7020 Olecranon bursitis, unspecified elbow: Secondary | ICD-10-CM

## 2014-05-30 DIAGNOSIS — M1710 Unilateral primary osteoarthritis, unspecified knee: Secondary | ICD-10-CM

## 2014-05-30 DIAGNOSIS — M19039 Primary osteoarthritis, unspecified wrist: Secondary | ICD-10-CM

## 2014-05-30 DIAGNOSIS — M25569 Pain in unspecified knee: Secondary | ICD-10-CM

## 2014-05-30 DIAGNOSIS — S52041D Displaced fracture of coronoid process of right ulna, subsequent encounter for closed fracture with routine healing: Secondary | ICD-10-CM

## 2014-05-30 DIAGNOSIS — M7022 Olecranon bursitis, left elbow: Secondary | ICD-10-CM

## 2014-05-30 DIAGNOSIS — M170 Bilateral primary osteoarthritis of knee: Secondary | ICD-10-CM

## 2014-05-30 DIAGNOSIS — M25539 Pain in unspecified wrist: Secondary | ICD-10-CM

## 2014-05-30 DIAGNOSIS — M19031 Primary osteoarthritis, right wrist: Secondary | ICD-10-CM | POA: Insufficient documentation

## 2014-05-30 NOTE — Assessment & Plan Note (Signed)
Continues to do well. At this point we are going to proceed with formal physical therapy.

## 2014-05-30 NOTE — Progress Notes (Signed)
  Subjective:    CC: Followup  HPI: Coronoid process fracture: Approximately 4-5 weeks out, doing well.  Left olecranon bursitis: Resolved after aspiration and injection 2 weeks ago.  Bilateral knee pain: Localize the joint line, moderate, persistent without radiation.  Right wrist pain: Localized at the dorsal radiocarpal joint, moderate, persistent without radiation, worse with extension.  Past medical history, Surgical history, Family history not pertinant except as noted below, Social history, Allergies, and medications have been entered into the medical record, reviewed, and no changes needed.   Review of Systems: No fevers, chills, night sweats, weight loss, chest pain, or shortness of breath.   Objective:    General: Well Developed, well nourished, and in no acute distress.  Neuro: Alert and oriented x3, extra-ocular muscles intact, sensation grossly intact.  HEENT: Normocephalic, atraumatic, pupils equal round reactive to light, neck supple, no masses, no lymphadenopathy, thyroid nonpalpable.  Skin: Warm and dry, no rashes. Cardiac: Regular rate and rhythm, no murmurs rubs or gallops, no lower extremity edema.  Respiratory: Clear to auscultation bilaterally. Not using accessory muscles, speaking in full sentences. Left elbow: Unremarkable to inspection. Range of motion full pronation, supination, flexion, extension. Strength is full to all of the above directions Stable to varus, valgus stress. Negative moving valgus stress test. No discrete areas of tenderness to palpation. Ulnar nerve does not sublux. Negative cubital tunnel Tinel's. Bilateral Knee: Normal to inspection with no erythema or effusion or obvious bony abnormalities. Tender to palpation at the medial joint lines ROM normal in flexion and extension and lower leg rotation. Ligaments with solid consistent endpoints including ACL, PCL, LCL, MCL. Negative Mcmurray's and provocative meniscal tests. Non painful  patellar compression. Patellar and quadriceps tendons unremarkable. Hamstring and quadriceps strength is normal. Right Wrist: Inspection normal with no visible erythema or swelling. ROM smooth and normal with good flexion and extension and ulnar/radial deviation that is symmetrical with opposite wrist. Tender to palpation at the radiocarpal joint. No snuffbox tenderness. No tenderness over Canal of [place]. Strength 5/5 in all directions without pain. Negative Finkelstein, tinel's and phalens. Negative Watson's test.  Impression and Recommendations:

## 2014-05-30 NOTE — Assessment & Plan Note (Signed)
X-rays, return for injection in the month if no better.

## 2014-05-30 NOTE — Assessment & Plan Note (Signed)
Resolved after aspiration and injection 

## 2014-05-30 NOTE — Assessment & Plan Note (Signed)
Likely early osteoarthritis, bilateral x-rays. Return in a month.

## 2014-06-01 ENCOUNTER — Ambulatory Visit (INDEPENDENT_AMBULATORY_CARE_PROVIDER_SITE_OTHER): Payer: BC Managed Care – PPO | Admitting: Physical Therapy

## 2014-06-01 DIAGNOSIS — R609 Edema, unspecified: Secondary | ICD-10-CM

## 2014-06-01 DIAGNOSIS — M2560 Stiffness of unspecified joint, not elsewhere classified: Secondary | ICD-10-CM

## 2014-06-01 DIAGNOSIS — M6281 Muscle weakness (generalized): Secondary | ICD-10-CM

## 2014-06-01 DIAGNOSIS — S5290XD Unspecified fracture of unspecified forearm, subsequent encounter for closed fracture with routine healing: Secondary | ICD-10-CM

## 2014-06-01 DIAGNOSIS — M25539 Pain in unspecified wrist: Secondary | ICD-10-CM

## 2014-06-07 ENCOUNTER — Encounter: Payer: BC Managed Care – PPO | Admitting: Physical Therapy

## 2014-06-19 ENCOUNTER — Encounter (INDEPENDENT_AMBULATORY_CARE_PROVIDER_SITE_OTHER): Payer: BC Managed Care – PPO | Admitting: Physical Therapy

## 2014-06-19 DIAGNOSIS — M25539 Pain in unspecified wrist: Secondary | ICD-10-CM

## 2014-06-19 DIAGNOSIS — R609 Edema, unspecified: Secondary | ICD-10-CM

## 2014-06-19 DIAGNOSIS — M2560 Stiffness of unspecified joint, not elsewhere classified: Secondary | ICD-10-CM

## 2014-06-19 DIAGNOSIS — S5290XD Unspecified fracture of unspecified forearm, subsequent encounter for closed fracture with routine healing: Secondary | ICD-10-CM

## 2014-06-19 DIAGNOSIS — M6281 Muscle weakness (generalized): Secondary | ICD-10-CM

## 2014-06-27 ENCOUNTER — Encounter: Payer: Self-pay | Admitting: Sports Medicine

## 2014-06-27 ENCOUNTER — Ambulatory Visit (INDEPENDENT_AMBULATORY_CARE_PROVIDER_SITE_OTHER): Payer: BC Managed Care – PPO | Admitting: Sports Medicine

## 2014-06-27 VITALS — BP 125/84 | HR 92 | Ht 69.0 in | Wt 195.0 lb

## 2014-06-27 DIAGNOSIS — M170 Bilateral primary osteoarthritis of knee: Secondary | ICD-10-CM

## 2014-06-27 DIAGNOSIS — M19031 Primary osteoarthritis, right wrist: Secondary | ICD-10-CM

## 2014-06-27 DIAGNOSIS — M19039 Primary osteoarthritis, unspecified wrist: Secondary | ICD-10-CM

## 2014-06-27 DIAGNOSIS — M1710 Unilateral primary osteoarthritis, unspecified knee: Secondary | ICD-10-CM

## 2014-06-27 NOTE — Assessment & Plan Note (Signed)
Injection as above 

## 2014-06-27 NOTE — Progress Notes (Signed)
  Subjective:    CC: Followup  HPI: Right wrist osteoarthritis: Failed physical therapy, NSAIDs, desires injection.  Left knee osteoarthritis: Failed physical therapy, NSAIDs, desires injection.  Right elbow pain: Resolved.  Past medical history, Surgical history, Family history not pertinant except as noted below, Social history, Allergies, and medications have been entered into the medical record, reviewed, and no changes needed.   Review of Systems: No fevers, chills, night sweats, weight loss, chest pain, or shortness of breath.   Objective:    General: Well Developed, well nourished, and in no acute distress.  Neuro: Alert and oriented x3, extra-ocular muscles intact, sensation grossly intact.  HEENT: Normocephalic, atraumatic, pupils equal round reactive to light, neck supple, no masses, no lymphadenopathy, thyroid nonpalpable.  Skin: Warm and dry, no rashes. Cardiac: Regular rate and rhythm, no murmurs rubs or gallops, no lower extremity edema.  Respiratory: Clear to auscultation bilaterally. Not using accessory muscles, speaking in full sentences.  Procedure: Real-time Ultrasound Guided Injection of right radiocarpal joint Device: GE Logiq E  Verbal informed consent obtained.  Time-out conducted.  Noted no overlying erythema, induration, or other signs of local infection.  Skin prepped in a sterile fashion.  Local anesthesia: Topical Ethyl chloride.  With sterile technique and under real time ultrasound guidance:  1 cc kenalog 40, 3 cc lidocaine injected easily. Completed without difficulty  Pain immediately resolved suggesting accurate placement of the medication.  Advised to call if fevers/chills, erythema, induration, drainage, or persistent bleeding.  Images permanently stored and available for review in the ultrasound unit.  Impression: Technically successful ultrasound guided injection.  Procedure: Real-time Ultrasound Guided Injection of left knee Device: GE  Logiq E  Verbal informed consent obtained.  Time-out conducted.  Noted no overlying erythema, induration, or other signs of local infection.  Skin prepped in a sterile fashion.  Local anesthesia: Topical Ethyl chloride.  With sterile technique and under real time ultrasound guidance:  2 cc kenalog 40, 4 cc lidocaine injected easily. Completed without difficulty  Pain immediately resolved suggesting accurate placement of the medication.  Advised to call if fevers/chills, erythema, induration, drainage, or persistent bleeding.  Images permanently stored and available for review in the ultrasound unit.  Impression: Technically successful ultrasound guided injection.  Impression and Recommendations:

## 2014-06-27 NOTE — Assessment & Plan Note (Signed)
Left knee injection as above, continue PT. Return in one month.

## 2014-07-03 ENCOUNTER — Encounter (INDEPENDENT_AMBULATORY_CARE_PROVIDER_SITE_OTHER): Payer: BC Managed Care – PPO | Admitting: Physical Therapy

## 2014-07-03 DIAGNOSIS — R609 Edema, unspecified: Secondary | ICD-10-CM

## 2014-07-03 DIAGNOSIS — M25539 Pain in unspecified wrist: Secondary | ICD-10-CM

## 2014-07-03 DIAGNOSIS — M2560 Stiffness of unspecified joint, not elsewhere classified: Secondary | ICD-10-CM

## 2014-07-03 DIAGNOSIS — S5290XD Unspecified fracture of unspecified forearm, subsequent encounter for closed fracture with routine healing: Secondary | ICD-10-CM

## 2014-07-03 DIAGNOSIS — M6281 Muscle weakness (generalized): Secondary | ICD-10-CM

## 2014-07-25 ENCOUNTER — Encounter: Payer: Self-pay | Admitting: Sports Medicine

## 2014-07-25 ENCOUNTER — Ambulatory Visit (INDEPENDENT_AMBULATORY_CARE_PROVIDER_SITE_OTHER): Payer: BC Managed Care – PPO | Admitting: Sports Medicine

## 2014-07-25 VITALS — BP 131/86 | HR 86 | Ht 69.0 in | Wt 199.0 lb

## 2014-07-25 DIAGNOSIS — M170 Bilateral primary osteoarthritis of knee: Secondary | ICD-10-CM | POA: Diagnosis not present

## 2014-07-25 DIAGNOSIS — M19031 Primary osteoarthritis, right wrist: Secondary | ICD-10-CM

## 2014-07-25 MED ORDER — MELOXICAM 15 MG PO TABS: ORAL_TABLET | ORAL | Status: DC

## 2014-07-25 NOTE — Assessment & Plan Note (Signed)
Right knee is doing well, left knee is 99% pain-free after injection. Adding Mobic. We can do Visco supplementation if pain is persistent.

## 2014-07-25 NOTE — Progress Notes (Signed)
  Subjective:    CC: Followup  HPI: Right wrist osteoarthritis: Resolved after injection.  Left knee osteoarthritis: Resolved after injection, only has a slight amount of pain and not taking any NSAIDs.  Past medical history, Surgical history, Family history not pertinant except as noted below, Social history, Allergies, and medications have been entered into the medical record, reviewed, and no changes needed.   Review of Systems: No fevers, chills, night sweats, weight loss, chest pain, or shortness of breath.   Objective:    General: Well Developed, well nourished, and in no acute distress.  Neuro: Alert and oriented x3, extra-ocular muscles intact, sensation grossly intact.  HEENT: Normocephalic, atraumatic, pupils equal round reactive to light, neck supple, no masses, no lymphadenopathy, thyroid nonpalpable.  Skin: Warm and dry, no rashes. Cardiac: Regular rate and rhythm, no murmurs rubs or gallops, no lower extremity edema.  Respiratory: Clear to auscultation bilaterally. Not using accessory muscles, speaking in full sentences. Right Wrist: Inspection normal with no visible erythema or swelling. ROM smooth and normal with good flexion and extension and ulnar/radial deviation that is symmetrical with opposite wrist. Palpation is normal over metacarpals, navicular, lunate, and TFCC; tendons without tenderness/ swelling No snuffbox tenderness. No tenderness over Canal of [place]. Strength 5/5 in all directions without pain. Negative Finkelstein, tinel's and phalens. Negative Watson's test. Left Knee: Normal to inspection with no erythema or effusion or obvious bony abnormalities. Palpation normal with no warmth or joint line tenderness or patellar tenderness or condyle tenderness. ROM normal in flexion and extension and lower leg rotation. Ligaments with solid consistent endpoints including ACL, PCL, LCL, MCL. Negative Mcmurray's and provocative meniscal tests. Non painful  patellar compression. Patellar and quadriceps tendons unremarkable. Hamstring and quadriceps strength is normal.  Impression and Recommendations:

## 2014-07-25 NOTE — Assessment & Plan Note (Signed)
Pain-free after radiocarpal injection at the last visit.

## 2014-12-14 IMAGING — CR DG KNEE COMPLETE 4+V*R*
3 series · 3 of 3 positions shown · non-contrast
Comparison: None.

CLINICAL DATA: Pain 3 months.  No injury.

EXAM:
RIGHT KNEE - COMPLETE 4+ VIEW

[view not recorded (1 of 3)]
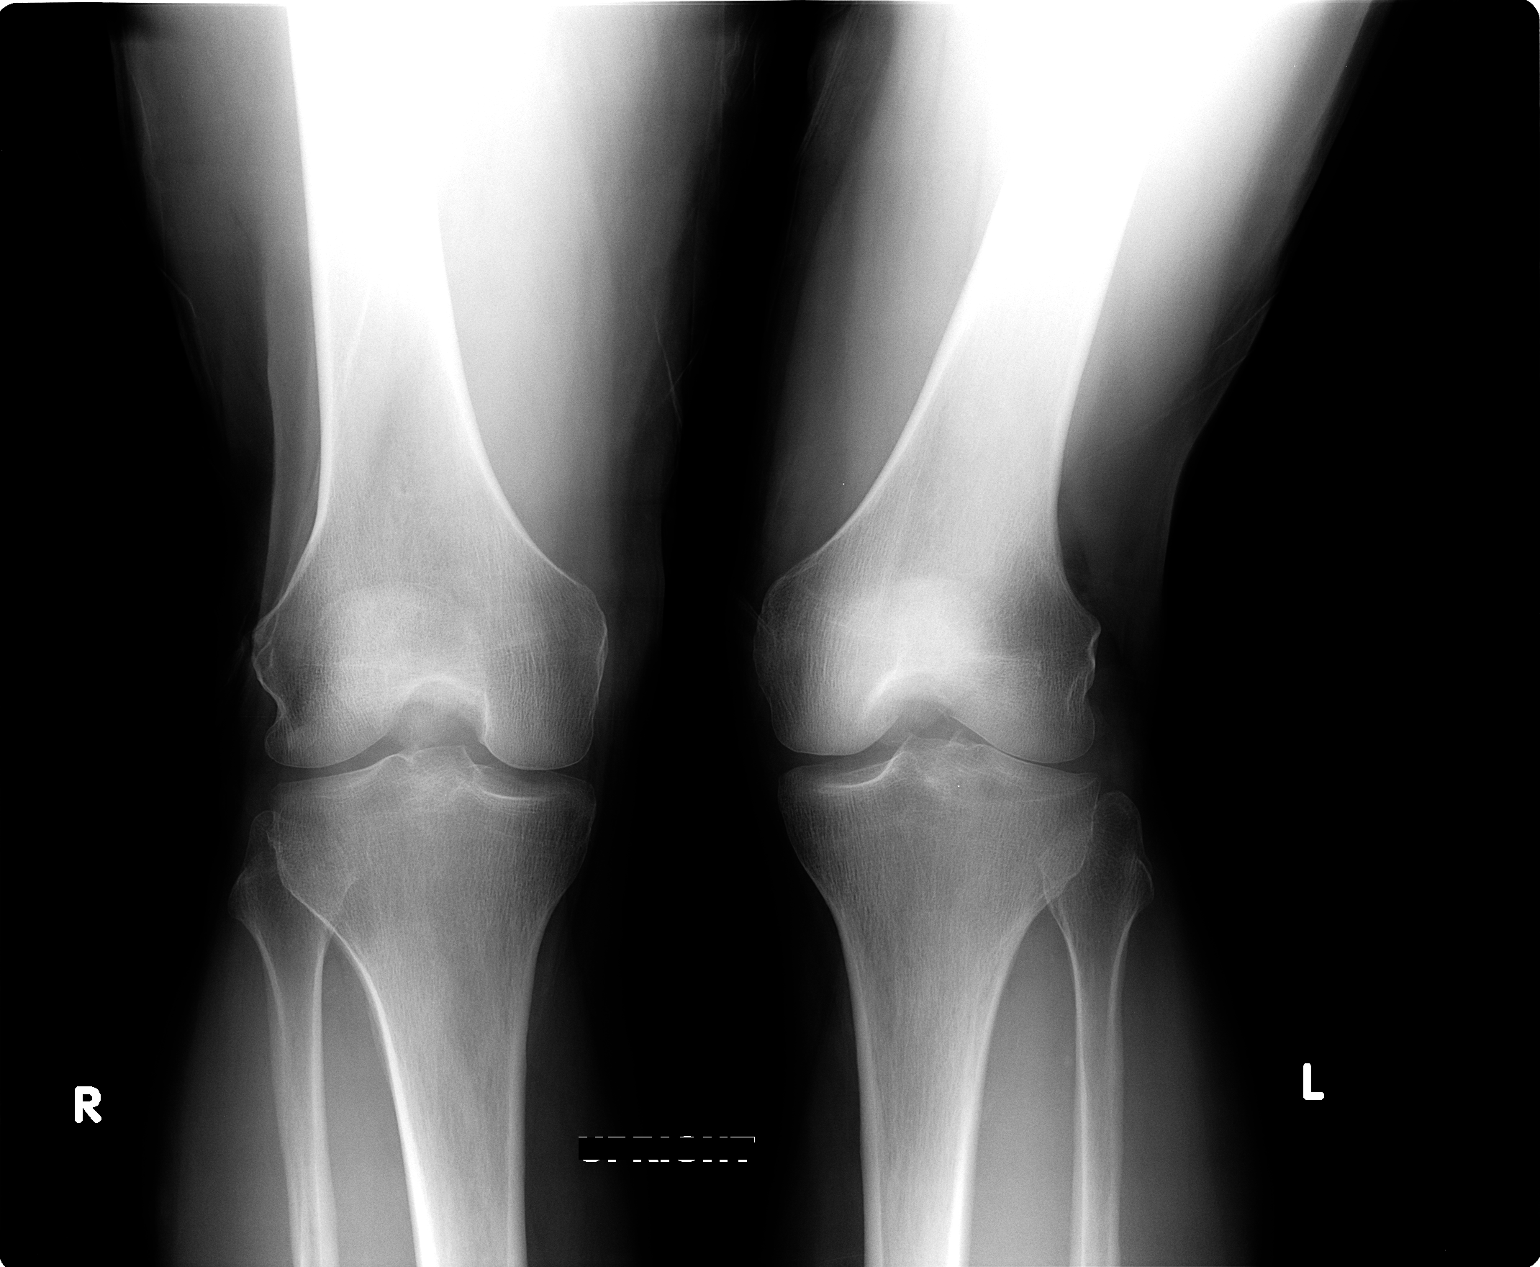

[view not recorded (2 of 3)]
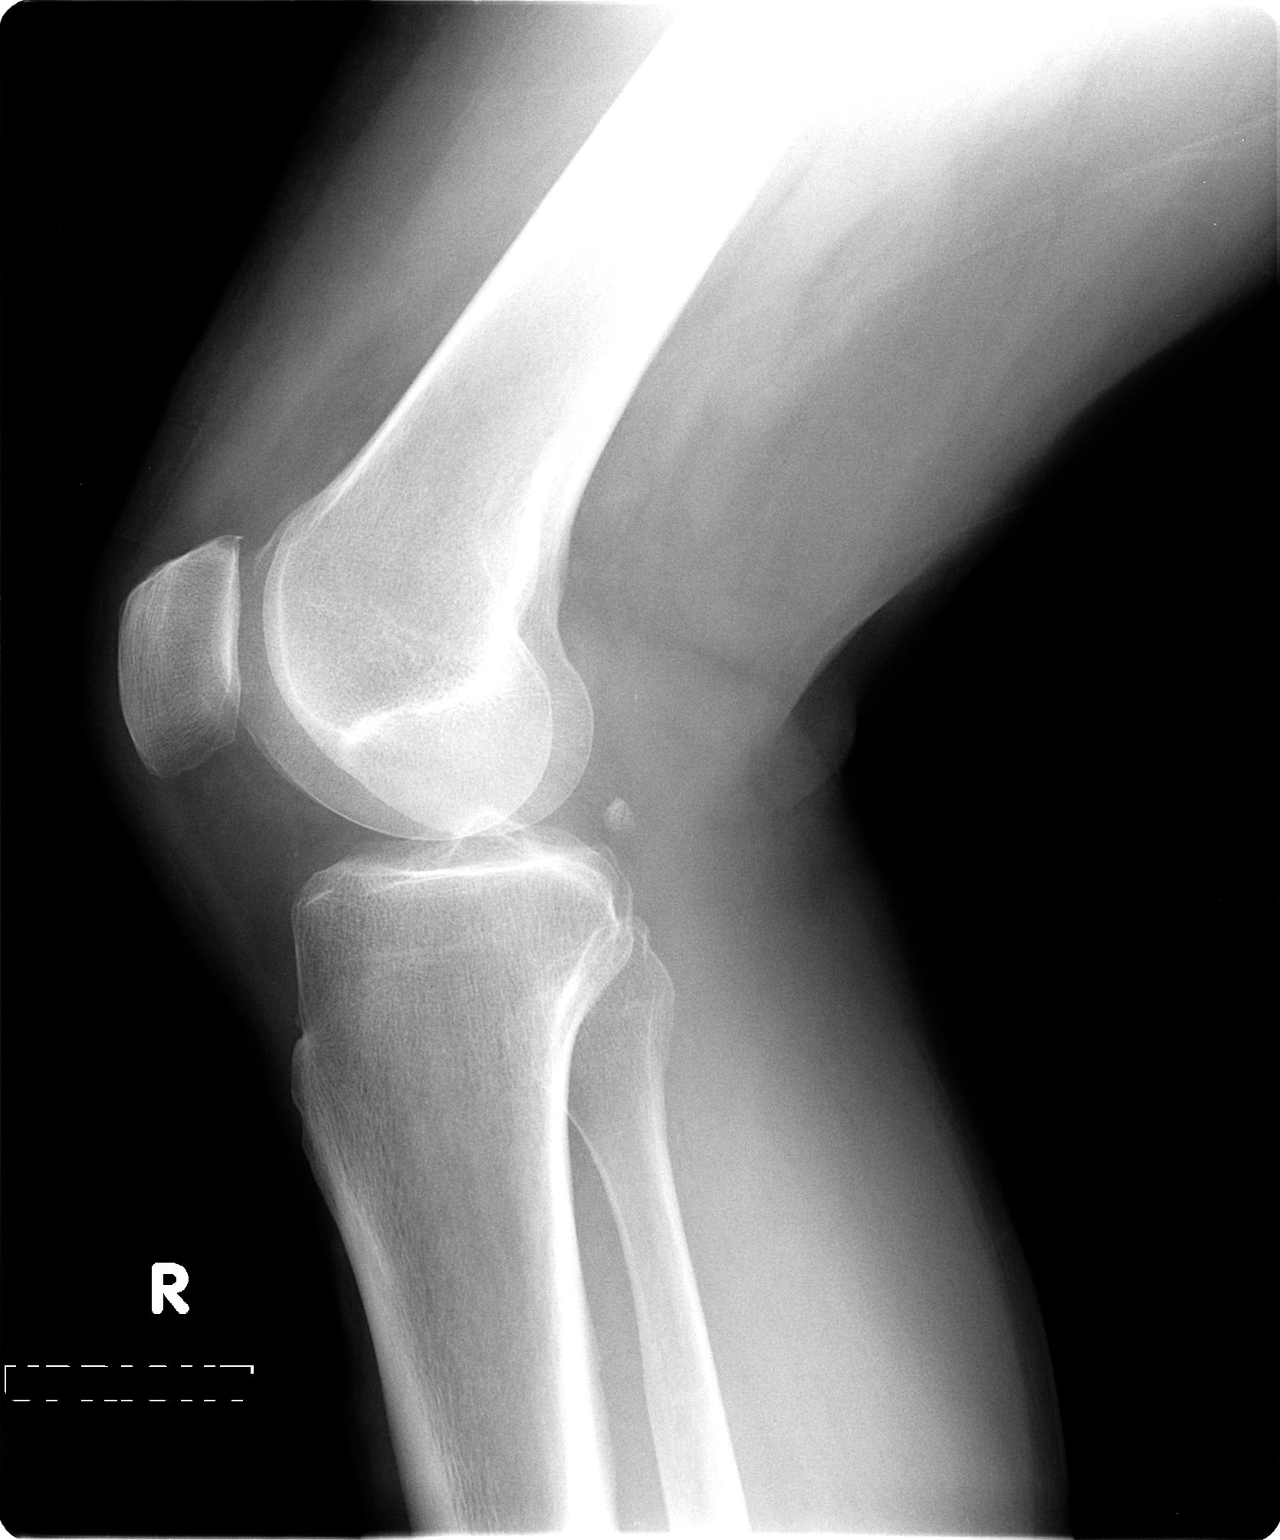

[view not recorded (3 of 3)]
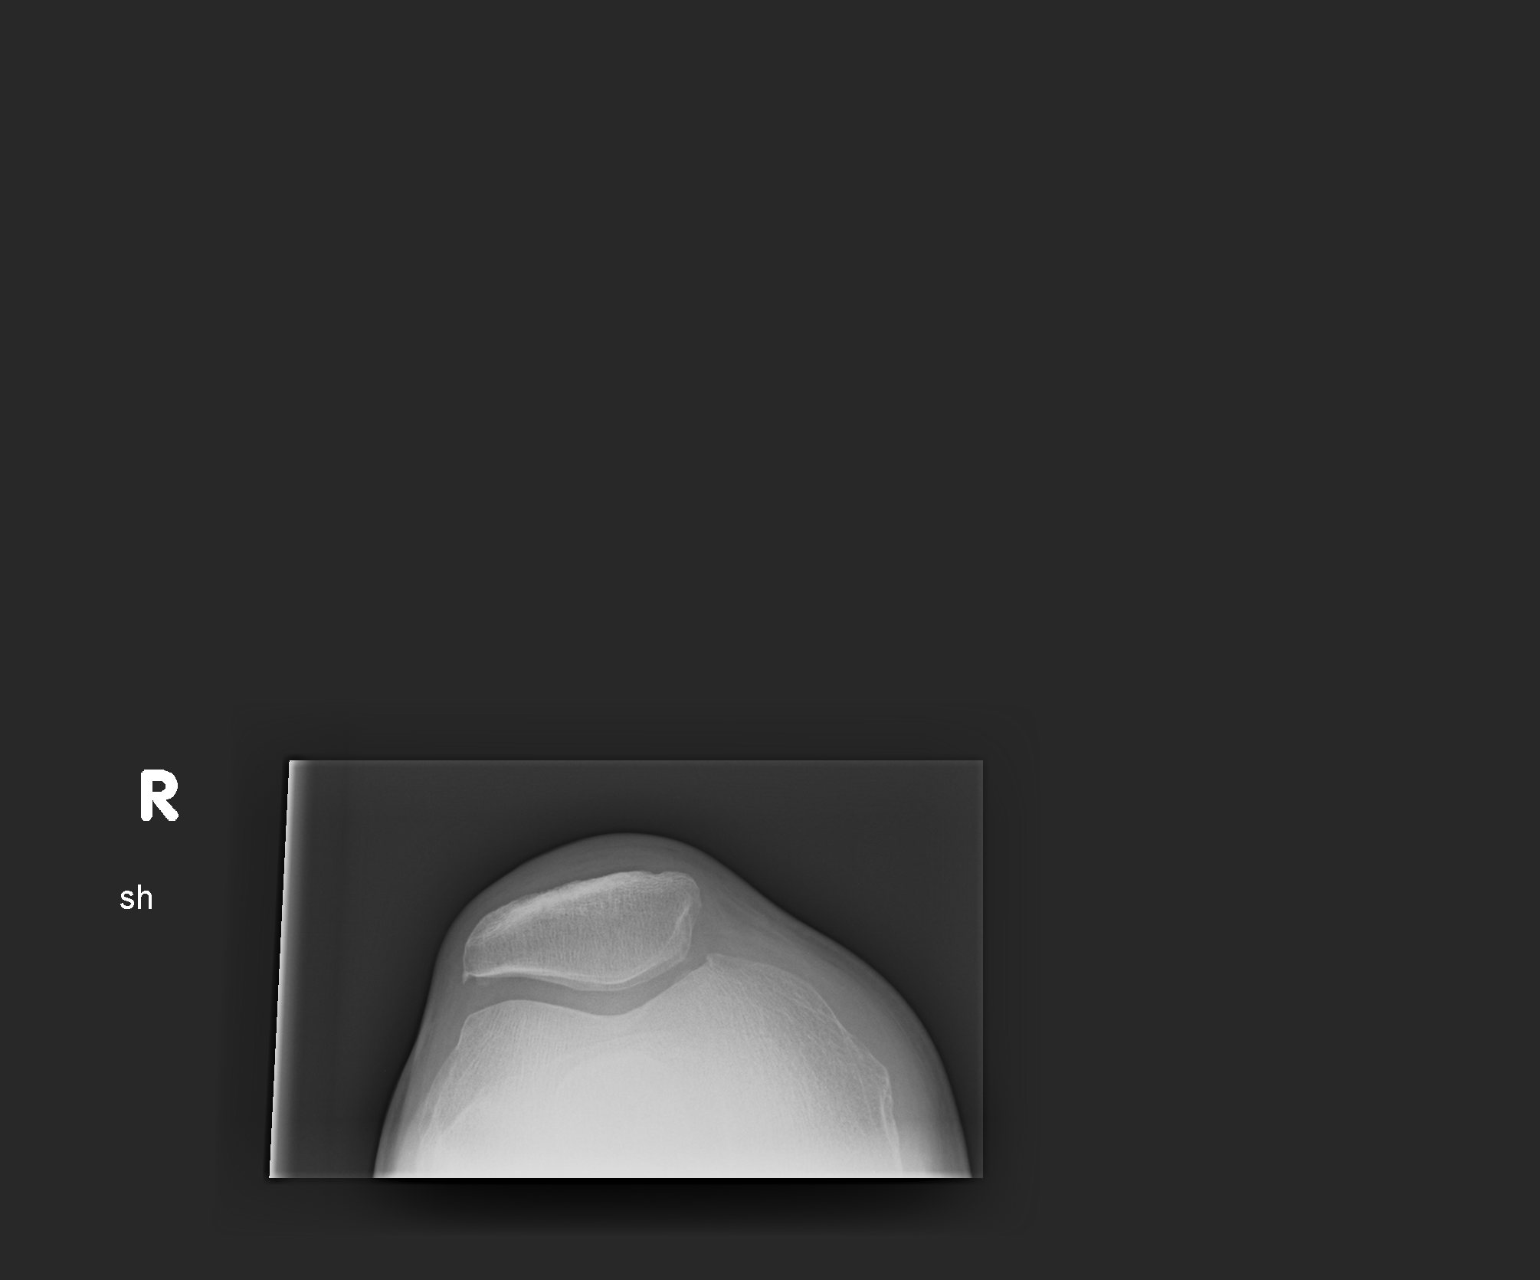

[3 of 3 positions shown; findings below may reference images not displayed]

FINDINGS: Subtle early degenerative change over the patellofemoral joint.
Remainder the exam is unremarkable.
IMPRESSION: No acute findings.

Subtle early degenerative change of the patellofemoral joint.

## 2014-12-14 IMAGING — CR DG WRIST COMPLETE 3+V*R*
2 series · 2 of 2 positions shown · non-contrast
Comparison: 08/01/2013

CLINICAL DATA: Right wrist pain.

EXAM:
RIGHT WRIST - COMPLETE 3+ VIEW

[view not recorded (1 of 2)]
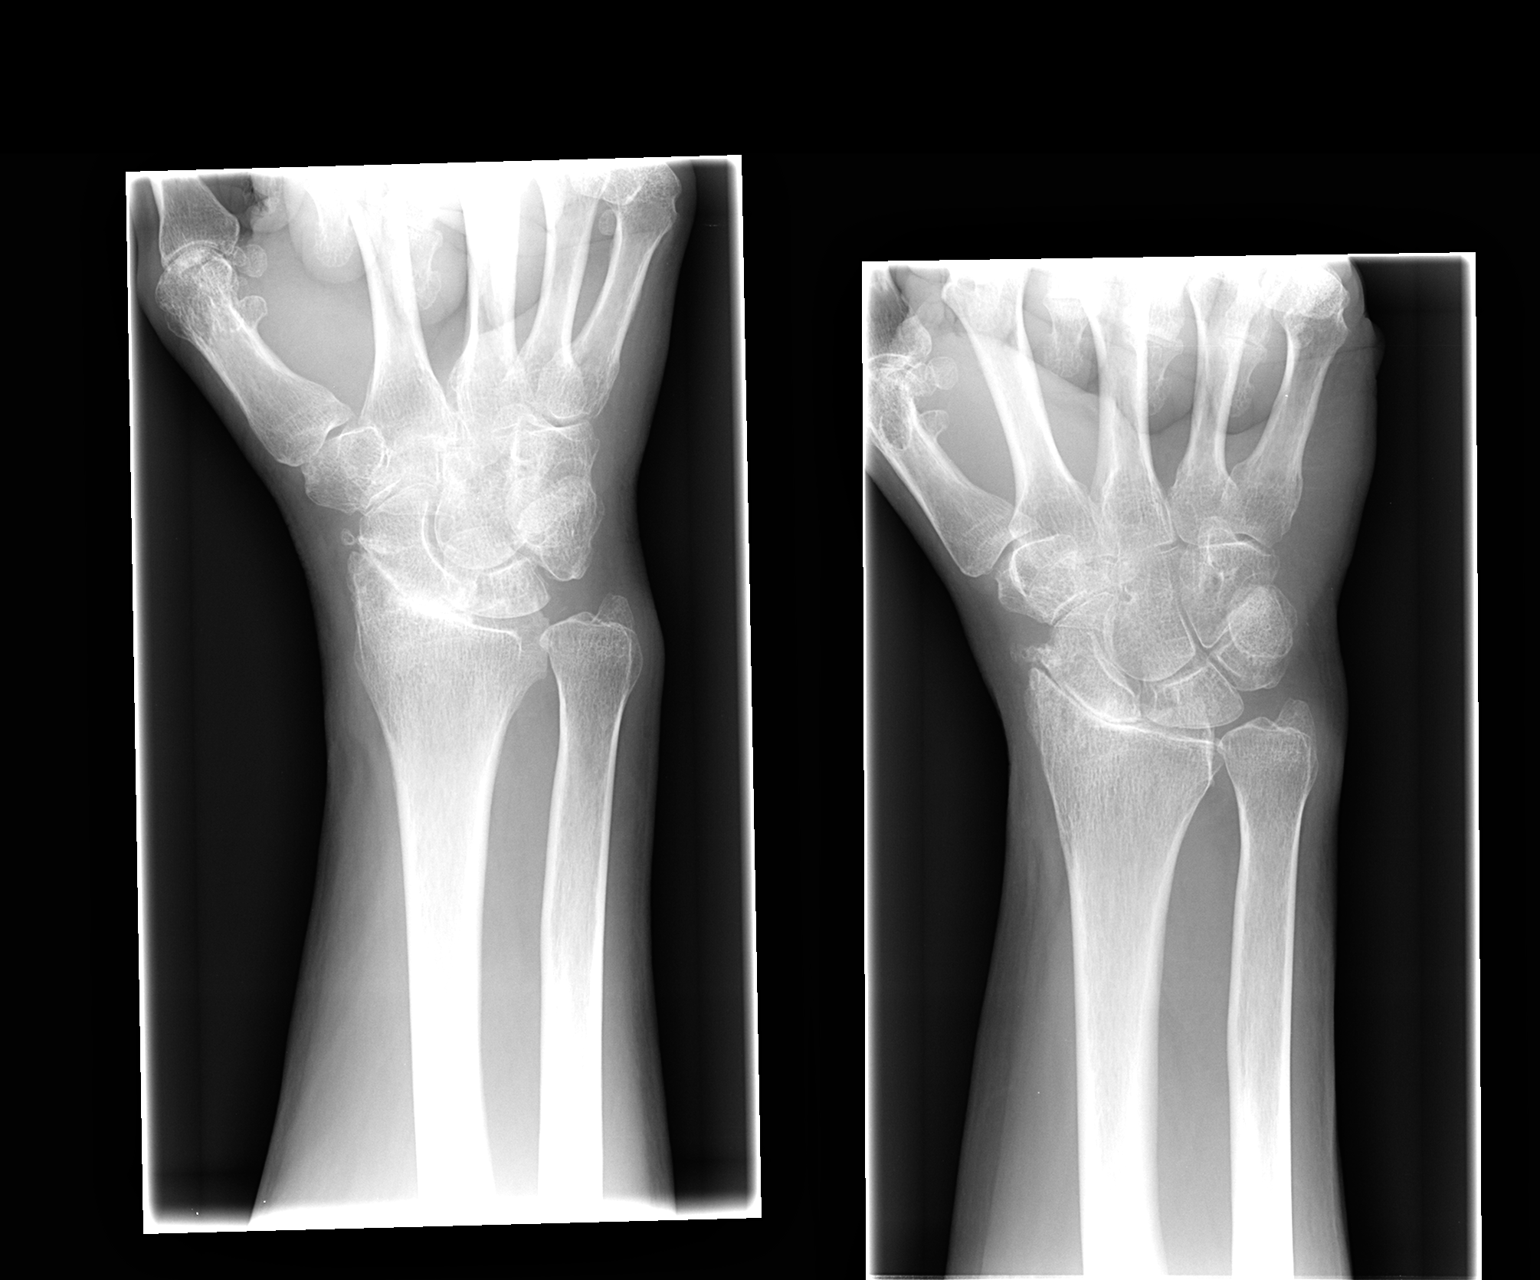

[view not recorded (2 of 2)]
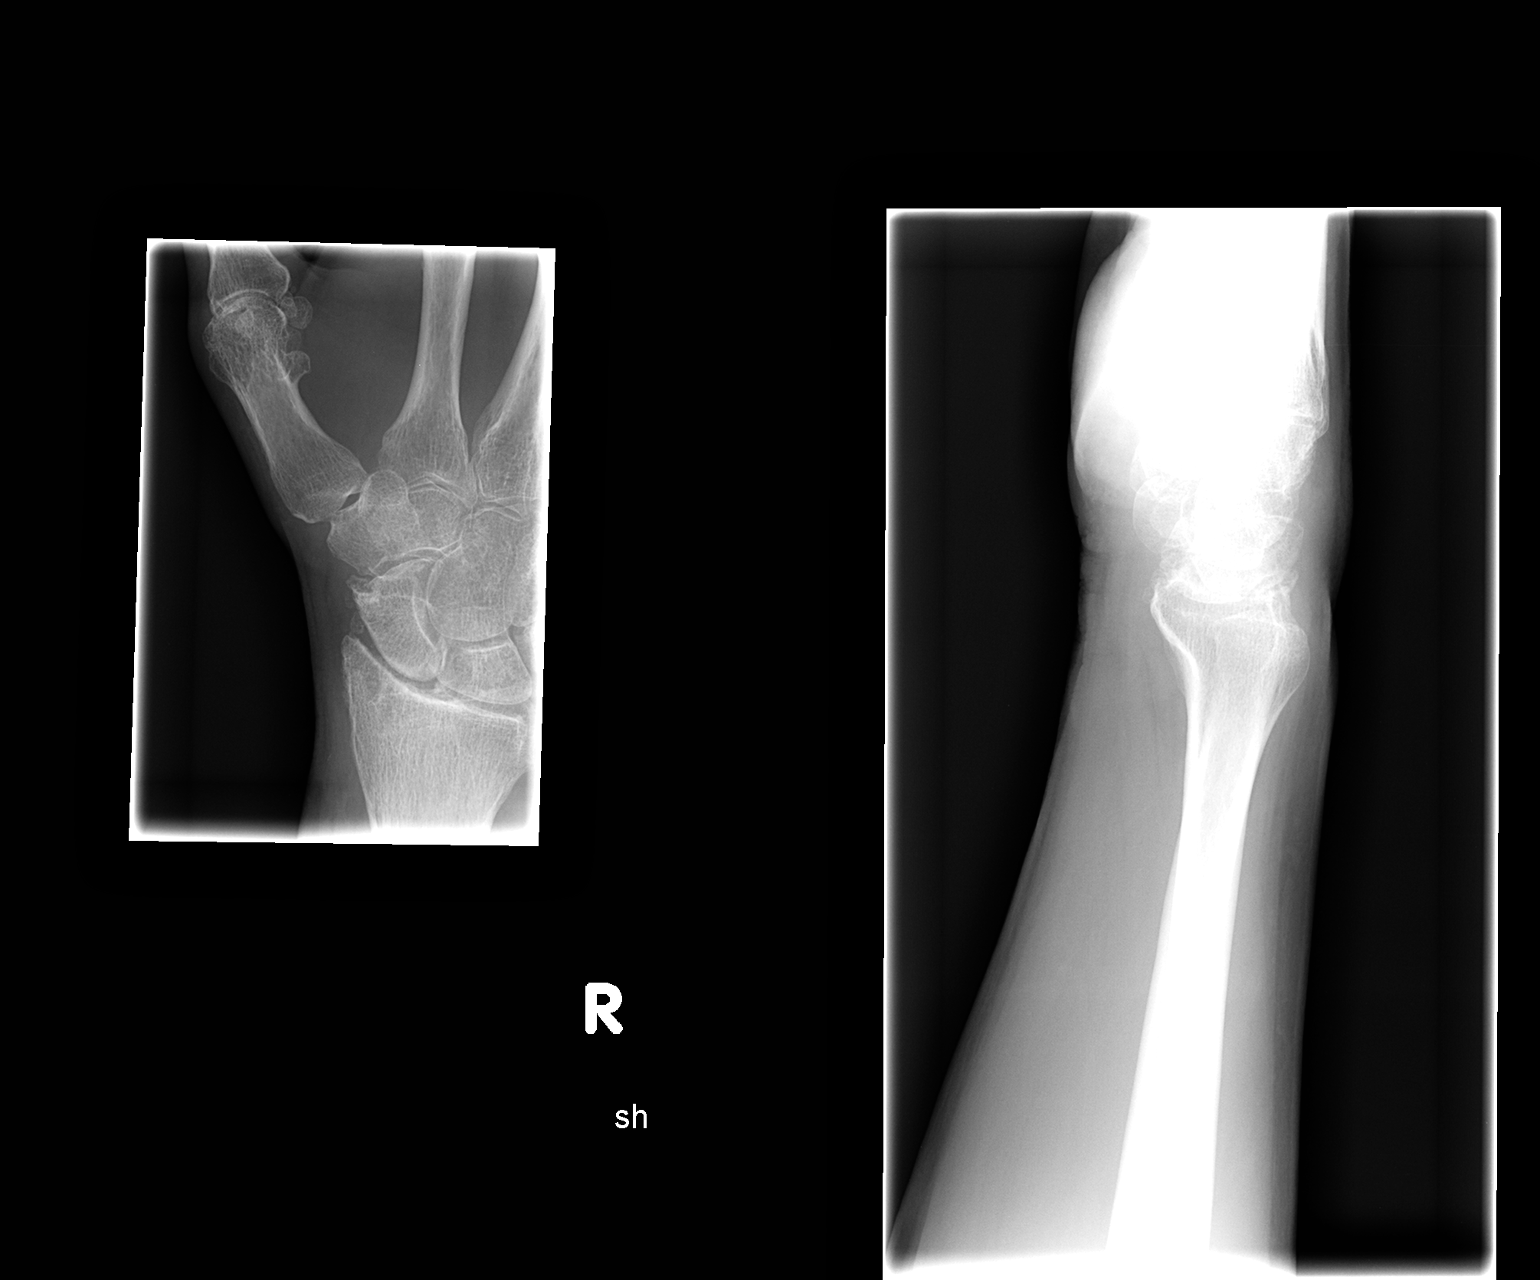

[2 of 2 positions shown; findings below may reference images not displayed]

FINDINGS: Again noted is severe joint space narrowing and degenerative changes
at the radiocarpal joint. Negative for an acute fracture or
dislocation. Degenerative changes along the distal scaphoid bone.
These findings are similar to the previous examination. There
appears to be some chondrocalcinosis which is similar to the
previous examination.
IMPRESSION: No acute bone abnormality in the right wrist.

Marked joint space narrowing at the radiocarpal joint which could be
related to CPPD arthropathy and/or osteoarthritis.

## 2017-06-25 ENCOUNTER — Encounter: Payer: Self-pay | Admitting: *Deleted

## 2017-06-25 ENCOUNTER — Emergency Department (INDEPENDENT_AMBULATORY_CARE_PROVIDER_SITE_OTHER)
Admission: EM | Admit: 2017-06-25 | Discharge: 2017-06-25 | Disposition: A | Discharge status: Home / Self Care | Payer: 59 | Source: Home / Self Care | Attending: Family Medicine | Admitting: Family Medicine

## 2017-06-25 DIAGNOSIS — M7041 Prepatellar bursitis, right knee: Secondary | ICD-10-CM | POA: Diagnosis not present

## 2017-06-25 MED ORDER — PREDNISONE 20 MG PO TABS: ORAL_TABLET | ORAL | 0 refills | Status: AC

## 2017-06-25 NOTE — Discharge Instructions (Signed)
Apply ice to your knee. Put ice in a plastic bag. Place a towel between your skin and the bag. Leave the ice on for 20 minutes, 2-3 times a day. Begin knee exercises as tolerated. Wear knee brace until improved. May take Tylenol as needed for pain.

## 2017-06-25 NOTE — ED Triage Notes (Signed)
Patient c/o right knee pain without injury x 2 days. Pain started 2 weeks ago and resolved. Used OTC IBF and ice.

## 2017-06-25 NOTE — ED Provider Notes (Signed)
[facility]    CSN: [id number] Arrival date & time: 06/25/17  0850     History   Chief Complaint Chief Complaint  Patient presents with  . Knee Pain    HPI Greg Warner is a 61 y.o. male.   Patient complains of pain in his right anterior knee for about 2 weeks.  He recalls no injury, but admits that he had been kneeling on his right knee to work on a floor about two weeks ago.   The history is provided by the patient.  Knee Pain  Location:  Knee Time since incident:  2 weeks Injury: no   Knee location:  R knee Pain details:    Quality:  Aching   Radiates to:  Does not radiate   Severity:  Mild   Onset quality:  Gradual   Duration:  2 weeks   Timing:  Constant   Progression:  Worsening Chronicity:  New Prior injury to area:  No Relieved by:  Nothing Worsened by:  Flexion Ineffective treatments:  Ice and NSAIDs Associated symptoms: decreased ROM, stiffness and swelling   Associated symptoms: no fatigue, no fever, no itching, no muscle weakness, no numbness and no tingling     Past Medical History:  Diagnosis Date  . Hyperlipemia   . Hypertension     Patient Active Problem List   Diagnosis Date Noted  . 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 Osteoarthritis of both knees 05/30/2014  . Olecranon bursitis of left elbow 05/24/2014  . Fracture of coronoid process of ulna, right, closed, with osteoarthritis 04/13/2014  . HYPERTENSION 02/14/2011  . DIVERTICULOSIS, SIGMOID COLON 02/14/2011    Past Surgical History:  Procedure Laterality Date  . PELVIS CLOSED REDUCTION         Home Medications    Prior to Admission medications   Medication Sig Start Date End Date Taking? Authorizing Provider  allopurinol (ZYLOPRIM) 300 MG tablet Take 300 mg by mouth daily.   Yes [provider]  amLODipine-benazepril (LOTREL) 5-10 MG capsule Take 1 capsule by mouth daily.   Yes [provider]  aspirin 325 MG tablet Take 325 mg by mouth  daily.   Yes [provider]  atorvastatin (LIPITOR) 20 MG tablet Take 20 mg by mouth daily.   Yes [provider]  predniSONE (DELTASONE) 20 MG tablet Take one tab by mouth twice daily for 4 days, then one daily. Take with food. 06/25/17   Lattie HawBeese, Stephen A, MD    Family History Family History  Problem Relation Age of Onset  . Cancer Father     Social History Social History  Substance Use Topics  . Smoking status: Never Smoker  . Smokeless tobacco: Never Used  . Alcohol use Yes     Comment: 12 qwk     Allergies   Patient has no known allergies.   Review of Systems Review of Systems  Constitutional: Negative for fatigue and fever.  Musculoskeletal: Positive for stiffness.  Skin: Negative for itching.  All other systems reviewed and are negative.    Physical Exam Triage Vital Signs ED Triage Vitals  Enc Vitals Group     BP 06/25/17 0911 118/81     Pulse Rate 06/25/17 0911 89     Resp --      Temp --      Temp src --      SpO2 06/25/17 0911 97 %     Weight 06/25/17 0912 198 lb (89.8 kg)  Height 06/25/17 0912  (1.727 m)     Head Circumference --      Peak Flow --      Pain Score 06/25/17 0912 4     Pain Loc --      Pain Edu? --      Excl. in GC? --    No data found.   Updated Vital Signs BP 118/81 (BP Location: Left Arm)   Pulse 89   Ht  (1.727 m)   Wt 198 lb (89.8 kg)   SpO2 97%   BMI 30.11 kg/m   Visual Acuity Right Eye Distance:   Left Eye Distance:   Bilateral Distance:    Right Eye Near:   Left Eye Near:    Bilateral Near:     Physical Exam  Constitutional: He appears well-developed and well-nourished. No distress.  HENT:  Head: Normocephalic.  Eyes: Pupils are equal, round, and reactive to light.  Cardiovascular: Normal rate.   Pulmonary/Chest: Effort normal.  Musculoskeletal:       Right knee: He exhibits decreased range of motion and swelling. He exhibits no effusion, no ecchymosis, no deformity, no  laceration, no erythema and normal alignment. Tenderness found.       Legs: Right knee:  No effusion or erythema.  Knee stable, negative drawer test.  McMurray test negative.  There is mild warmth over the patella.  Patient has difficulty flexing more than about 90 degrees.  There is tenderness to palpation over the pre-patellar bursa.  Neurological: He is alert.  Skin: Skin is warm and dry.  Nursing note and vitals reviewed.    UC Treatments / Results  Labs (all labs ordered are listed, but only abnormal results are displayed) Labs Reviewed - No data to display  EKG  EKG Interpretation None       Radiology No results found.  Procedures Procedures (including critical care time)  Medications Ordered in UC Medications - No data to display   Initial Impression / Assessment and Plan / UC Course  I have reviewed the triage vital signs and the nursing notes.  Pertinent labs & imaging results that were available during my care of the patient were reviewed by me and considered in my medical decision making (see chart for details).    Hinged knee brace applied. Begin prednisone burst/taper.  Apply ice to your knee. ? Put ice in a plastic bag. ? Place a towel between your skin and the bag. ? Leave the ice on for 20 minutes, 2-3 times a day.  Begin knee exercises as tolerated.  Wear knee brace until improved. May take Tylenol as needed for pain. Followup with [facility]) if not improving about two weeks.     Final Clinical Impressions(s) / UC Diagnoses   Final diagnoses:  Bursitis, prepatellar, right    New Prescriptions New Prescriptions   PREDNISONE (DELTASONE) 20 MG TABLET    Take one tab by mouth twice daily for 4 days, then one daily. Take with food.         Lattie Haw, MD 06/25/17 1357
